# Patient Record
Sex: Male | Born: 1995 | Race: White | Hispanic: No | Marital: Single | Smoking: Never smoker
Health system: Southern US, Community
[De-identification: ages and names within clinical notes are randomized; demographics above are authoritative.]

## PROBLEM LIST (undated history)

## (undated) DIAGNOSIS — F32A Depression, unspecified: Secondary | ICD-10-CM

## (undated) DIAGNOSIS — F419 Anxiety disorder, unspecified: Secondary | ICD-10-CM

## (undated) DIAGNOSIS — F329 Major depressive disorder, single episode, unspecified: Secondary | ICD-10-CM

## (undated) HISTORY — DX: Anxiety disorder, unspecified: F41.9

## (undated) HISTORY — DX: Major depressive disorder, single episode, unspecified: F32.9

## (undated) HISTORY — DX: Depression, unspecified: F32.A

---
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
## 2014-03-11 ENCOUNTER — Ambulatory Visit (INDEPENDENT_AMBULATORY_CARE_PROVIDER_SITE_OTHER): Payer: BLUE CROSS/BLUE SHIELD | Admitting: Emergency Medicine

## 2014-03-11 VITALS — BP 140/90 | HR 64 | Temp 98.4°F | Resp 16 | Ht 66.0 in | Wt 173.0 lb

## 2014-03-11 DIAGNOSIS — F4323 Adjustment disorder with mixed anxiety and depressed mood: Secondary | ICD-10-CM

## 2014-03-11 DIAGNOSIS — E291 Testicular hypofunction: Secondary | ICD-10-CM

## 2014-03-11 LAB — FSH/LH
FSH: 2.7 m[IU]/mL (ref 1.4–18.1)
LH: 1.7 m[IU]/mL (ref 1.5–9.3)

## 2014-03-11 LAB — THYROID PANEL WITH TSH
Free Thyroxine Index: 2.3 (ref 1.4–3.8)
T3 UPTAKE: 30 % (ref 22–35)
T4 TOTAL: 7.5 ug/dL (ref 4.5–12.0)
TSH: 1.325 u[IU]/mL (ref 0.350–4.500)

## 2014-03-11 LAB — POCT CBC
GRANULOCYTE PERCENT: 55.6 % (ref 37–80)
HCT, POC: 48.2 % (ref 43.5–53.7)
HEMOGLOBIN: 16.3 g/dL (ref 14.1–18.1)
Lymph, poc: 2 (ref 0.6–3.4)
MCH: 33 pg — AB (ref 27–31.2)
MCHC: 33.9 g/dL (ref 31.8–35.4)
MCV: 97.4 fL — AB (ref 80–97)
MID (CBC): 0.5 (ref 0–0.9)
MPV: 6.1 fL (ref 0–99.8)
POC GRANULOCYTE: 3.2 (ref 2–6.9)
POC LYMPH PERCENT: 35.3 %L (ref 10–50)
POC MID %: 9.1 % (ref 0–12)
Platelet Count, POC: 259 10*3/uL (ref 142–424)
RBC: 4.95 M/uL (ref 4.69–6.13)
RDW, POC: 12 %
WBC: 5.7 10*3/uL (ref 4.6–10.2)

## 2014-03-11 LAB — CORTISOL: Cortisol, Plasma: 10.9 ug/dL

## 2014-03-11 NOTE — Progress Notes (Deleted)
   Subjective:    Patient ID: Damon Brown, male    DOB: 04/24/1995, 18 y.o.   MRN: [id number]  HPI    Review of Systems     Objective:   Physical Exam        Assessment & Plan:   

## 2014-03-11 NOTE — Progress Notes (Signed)
   Subjective:    Patient ID: Damon Brown, male    DOB: 16-Dec-1995, 19 y.o.   MRN: [id number] This chart was scribed for Damon ChrisSteven Dore Oquin, MD by Jolene Provostobert Halas, Medical Scribe. This patient was seen in Room 11 and the patient's care was started at 8:30 AM.  Chief Complaint  Patient presents with  . Establish Care  . Labs Only    Testosterone level low    HPI HPI Comments: Damon MaudlinWilliam C Jeffords is a 19 y.o. male with a hx of substance abuse, anxiety, depression, and SI who [facility] for a regular check up. Pt states he had a regular checkup recently with another physician who told him he has low Testerone. Pt states he has a hx of abusing benzos and opiates. Pt states he has lived in [place] and [place] for the last three years, and during that time he was in a residential substance abuse program, followed by an out patient drug treatment program. Pt states he is not sober right now, and has been smoking weed. Pt states he is not going to meetings right now. Pt denies using IV drugs.  Pt states he is working for an [employer] company, and is planning on attending college in the fall.     Review of Systems  Constitutional: Positive for fatigue. Negative for fever and chills.  Cardiovascular: Negative for chest pain.  Gastrointestinal: Negative for abdominal pain.  Musculoskeletal: Negative for back pain.  Skin: Negative for wound.       Objective:   Physical Exam  Constitutional: He is oriented to person, place, and time. He appears well-developed and well-nourished.  Very muscular male.  HENT:  Head: Normocephalic and atraumatic.  Eyes: Pupils are equal, round, and reactive to light.  Neck: Neck supple.  Cardiovascular: Normal rate and regular rhythm.   Pulmonary/Chest: Effort normal and breath sounds normal. No respiratory distress.  Genitourinary:  Testicles are a normal size, no masses.  Neurological: He is alert and oriented to person, place, and time.  Skin: Skin  is warm and dry.  Psychiatric: He has a normal mood and affect. His behavior is normal.  Nursing note and vitals reviewed.      Assessment & Plan:  Patient currently on the mechanical for mood disorder. Referral made to psychiatry for management of suspected mood disorder. He has a history of very low testosterone. 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 He is very muscular on exam. His testicle exam is normal. Routine labs were done today. I encouraged him to receive counseling through substance abuse counselor. He is resistant to this at present but will think about it. He continues to use marijuana.I personally performed the services described in this documentation, which was scribed in my presence. The recorded information has been reviewed and is accurate.

## 2014-03-12 LAB — TESTOSTERONE, FREE, TOTAL, SHBG
SEX HORMONE BINDING: 30 nmol/L (ref 10–50)
TESTOSTERONE FREE: 93.4 pg/mL (ref 47.0–244.0)
Testosterone-% Free: 2.2 % (ref 1.6–2.9)
Testosterone: 431 ng/dL (ref 300–890)

## 2014-03-18 LAB — PROLACTIN, TOTAL AND MONOMERIC
PROLACTIN, MONOMERIC: 3.8 ng/mL (ref 3.4–14.8)
PROLACTIN, TOTAL: 4.2 ng/mL (ref 2.0–18.0)

## 2014-04-07 ENCOUNTER — Encounter: Payer: Self-pay | Admitting: Emergency Medicine

## 2014-04-07 ENCOUNTER — Ambulatory Visit (INDEPENDENT_AMBULATORY_CARE_PROVIDER_SITE_OTHER): Payer: BLUE CROSS/BLUE SHIELD | Admitting: Emergency Medicine

## 2014-04-07 VITALS — BP 107/62 | HR 61 | Temp 97.9°F | Resp 16 | Ht 66.5 in | Wt 164.0 lb

## 2014-04-07 DIAGNOSIS — J029 Acute pharyngitis, unspecified: Secondary | ICD-10-CM

## 2014-04-07 DIAGNOSIS — F1910 Other psychoactive substance abuse, uncomplicated: Secondary | ICD-10-CM | POA: Diagnosis not present

## 2014-04-07 DIAGNOSIS — F39 Unspecified mood [affective] disorder: Secondary | ICD-10-CM

## 2014-04-07 LAB — POCT RAPID STREP A (OFFICE): Rapid Strep A Screen: NEGATIVE

## 2014-04-07 MED ORDER — LAMOTRIGINE 25 MG PO TABS: 25.0000 mg | ORAL_TABLET | Freq: Two times a day (BID) | ORAL | Status: DC

## 2014-04-07 NOTE — Progress Notes (Deleted)
   Subjective:    Patient ID: Damon Brown, male    DOB: 02/13/1995, 18 y.o.   MRN: [id number]  HPI    Review of Systems     Objective:   Physical Exam        Assessment & Plan:   

## 2014-04-07 NOTE — Progress Notes (Signed)
Subjective:  This chart was scribed for Damon LitesSteve Kiefer Opheim, MD by Damon PaoNadim Abu Brown, ED Scribe at [facility].The patient was seen in exam room 21 and the patient's care was started at 10:20 AM.   Patient ID: Damon Brown, male    DOB: 13-Aug-1995, 19 y.o.   MRN: [id number] Chief Complaint  Patient presents with  . Medication Management  . Cough  . Nasal Congestion   Cough Associated symptoms include a sore throat. Pertinent negatives include no fever.   HPI Comments: Damon Brown is a 19 y.o. male with a history of benzo and opiate abuse,  depression, anxiety and suicidal ideation who presents to [facility]. Pt is currently taking Lamictal 25 mg twice daily. His depression, anxiety and manic episodes have improved. He has been attending [facility] with his cousin. He does not have the funds to attend formally but the program is allowing him to come to meetings. Pt states the [facility] program has been very helpful. He has topped smoking marijuana 2.5 weeks ago. He starts working next week, at [employer] until next fall where he plans to go to school. He is still living with his father, who is currently sober. Pt is aware this is a bad environment and  he wants to move out but does not have the money to do so. He may live with grandmother but he must first prove himself. 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 He developed a sore throat onset 3 days ago. This gradually worsened on Sunday and Monday with associated productive cough, and congestion. He has been taking OTC medications for relief. He denies fever.  Patient Active Problem List   Diagnosis Date Noted  . Abnormal LFTs 11/24/2011  . Depression 03/30/2011  . Anxiety 03/30/2011  . Bulimia 03/30/2011  . Suicide ideation 03/30/2011  . School failure 03/30/2011   Past Medical History  Diagnosis Date  . Anxiety   . Depression    No past surgical history on  file. No Known Allergies Prior to Admission medications   Medication Sig Start Date End Date Taking? Authorizing Provider  amphetamine-dextroamphetamine (ADDERALL) 20 MG tablet  10/07/11   Historical Provider, MD  FLUoxetine (PROZAC) 40 MG capsule Take 60 mg by mouth daily.     Historical Provider, MD  ipratropium (ATROVENT) 0.02 % nebulizer solution  01/10/11   Historical Provider, MD  lamoTRIgine (LAMICTAL) 100 MG tablet Take 100 mg by mouth daily.    Historical Provider, MD  lansoprazole (PREVACID) 30 MG capsule  12/26/10   Historical Provider, MD  PROAIR HFA 108 (90 BASE) MCG/ACT inhaler  02/02/11   Historical Provider, MD   History   Social History  . Marital Status: Single    Spouse Name: N/A  . Number of Children: N/A  . Years of Education: N/A   Occupational History  . Not on file.   Social History Main Topics  . Smoking status: Never Smoker   . Smokeless tobacco: Not on file  . Alcohol Use: Not on file  . Drug Use: Not on file  . Sexual Activity: Not on file   Other Topics Concern  . Not on file   Social History Narrative   Review of Systems  Constitutional: Negative for fever.  HENT: Positive for congestion and sore throat.   Respiratory: Positive for cough.   Psychiatric/Behavioral: The patient is nervous/anxious.       Objective:  BP 107/62 mmHg  Pulse 61  Temp(Src) 97.9 F (36.6 C)  Resp 16  Ht 5' 6.5" (1.689 m)  Wt 164 lb (74.39 kg)  BMI 26.08 kg/m2  SpO2 97% Physical Exam  Nursing note and vitals reviewed. CONSTITUTIONAL: Well developed/well nourished HEAD: Normocephalic/atraumatic EYES: EOMI/PERRL ENMT: Mucous membranes moist, nasal congestion and mild redness to he back of this throat. NECK: supple no meningeal signs SPINE/BACK:entire spine nontender CV: S1/S2 noted, no murmurs/rubs/gallops noted LUNGS: Lungs are clear to auscultation bilaterally, no apparent distress ABDOMEN: soft, nontender, no rebound or guarding, bowel sounds noted  throughout abdomen GU:no cva tenderness NEURO: Pt is awake/alert/appropriate, moves all extremitiesx4.  No facial droop.   EXTREMITIES: pulses normal/equal, full ROM SKIN: warm, color normal PSYCH: no abnormalities of mood noted, alert and oriented to situation Results for orders placed or performed in visit on 04/07/14  POCT rapid strep A  Result Value Ref Range   Rapid Strep A Screen Negative Negative      Assessment & Plan:   Patient doing well. He is to call if he has worsening of his sinusitis. Otherwise we'll continue to treat symptomatically. Recheck in 6 weeks. He will continue with the current program he is working with. He is due to start work next week. He is looking for different place to stay at the present time. I did refill his Lamictal he takes twice a day.I personally performed the services described in this documentation, which was scribed in my presence. The recorded information has been reviewed and is accurate.

## 2014-04-07 NOTE — Progress Notes (Deleted)
   Subjective:    Patient ID: Damon Brown, male    DOB: 1995-11-25, 19 y.o.   MRN: [id number]  HPI    Review of Systems     Objective:   Physical Exam        Assessment & Plan:

## 2014-04-07 NOTE — Progress Notes (Signed)
Subjective:  This chart was scribed for [name], MD by Haywood Pao, ED Scribe at [facility].The patient was seen in exam room 21 and the patient's care was started at 10:20 AM.   Patient ID: Damon Brown, male    DOB: April 06, 1995, 19 y.o.   MRN: [id number] Chief Complaint  Patient presents with   Medication Management   Cough   Nasal Congestion   HPI HPI Comments: Damon Brown is a 19 y.o. male with a history of benzo and opiate abuse,  depression, anxiety and suicidal ideation who presents to [facility]. Pt is currently taking Lamictal 25 mg twice daily. His depression, anxiety and manic episodes have improved. He has been attending [employer] program with his cousin. He does not have the funds to attend formally but the program is allowing him to come to meetings. Pt states the [employer] program has been very helpful. He has topped smoking marijuana 2.5 weeks ago. He starts working next week, at [employer] until next fall where he plans to go to school. He is still living with his father, who is currently sober. Pt is aware this is a bad environment and  he wants to move out but does not have the money to do so. He may live with grandmother but he must first prove himself. 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 He developed a sore throat onset 3 days ago. This gradually worsened on Sunday and Monday with associated productive cough, and congestion. He has been taking OTC medications for relief. He denies fever.  Patient Active Problem List   Diagnosis Date Noted   Abnormal LFTs 11/24/2011   Depression 03/30/2011   Anxiety 03/30/2011   Bulimia 03/30/2011   Suicide ideation 03/30/2011   School failure 03/30/2011   Past Medical History  Diagnosis Date   Anxiety    Depression    No past surgical history on file. No Known Allergies Prior to Admission medications   Medication Sig Start Date End  Date Taking? Authorizing Provider  amphetamine-dextroamphetamine (ADDERALL) 20 MG tablet  10/07/11   Historical Provider, MD  FLUoxetine (PROZAC) 40 MG capsule Take 60 mg by mouth daily.     Historical Provider, MD  ipratropium (ATROVENT) 0.02 % nebulizer solution  01/10/11   Historical Provider, MD  lamoTRIgine (LAMICTAL) 100 MG tablet Take 100 mg by mouth daily.    Historical Provider, MD  lansoprazole (PREVACID) 30 MG capsule  12/26/10   Historical Provider, MD  PROAIR HFA 108 (90 BASE) MCG/ACT inhaler  02/02/11   Historical Provider, MD   History   Social History   Marital Status: Single    Spouse Name: N/A   Number of Children: N/A   Years of Education: N/A   Occupational History   Not on file.   Social History Main Topics   Smoking status: Never Smoker    Smokeless tobacco: Not on file   Alcohol Use: Not on file   Drug Use: Not on file   Sexual Activity: Not on file   Other Topics Concern   Not on file   Social History Narrative   Review of Systems  Constitutional: Negative for fever.  HENT: Positive for congestion and sore throat.   Respiratory: Positive for cough.   Psychiatric/Behavioral: The patient is nervous/anxious.       Objective:  BP 107/62 mmHg   Pulse 61   Temp(Src) 97.9 F (  36.6 C)   Resp 16   Ht 5' 6.5" (1.689 m)   Wt 164 lb (74.39 kg)   BMI 26.08 kg/m2   SpO2 97% Physical Exam  Nursing note and vitals reviewed. CONSTITUTIONAL: Well developed/well nourished HEAD: Normocephalic/atraumatic EYES: EOMI/PERRL ENMT: Mucous membranes moist, nasal congestion and mild redness to he back of this throat. NECK: supple no meningeal signs SPINE/BACK:entire spine nontender CV: S1/S2 noted, no murmurs/rubs/gallops noted LUNGS: Lungs are clear to auscultation bilaterally, no apparent distress ABDOMEN: soft, nontender, no rebound or guarding, bowel sounds noted throughout abdomen GU:no cva tenderness NEURO: Pt is awake/alert/appropriate, moves all  extremitiesx4.  No facial droop.   EXTREMITIES: pulses normal/equal, full ROM SKIN: warm, color normal PSYCH: no abnormalities of mood noted, alert and oriented to situation Results for orders placed or performed in visit on 04/07/14  POCT rapid strep A  Result Value Ref Range   Rapid Strep A Screen Negative Negative      Assessment & Plan:

## 2014-04-07 NOTE — Progress Notes (Deleted)
   Subjective:    Patient ID: Damon Brown, male    DOB: [birth year], 19 y.o.   MRN: [id number]  Cough      Review of Systems  Respiratory: Positive for cough.        Objective:   Physical Exam        Assessment & Plan:

## 2014-04-08 LAB — CULTURE, GROUP A STREP: ORGANISM ID, BACTERIA: NORMAL

## 2014-05-19 ENCOUNTER — Ambulatory Visit (INDEPENDENT_AMBULATORY_CARE_PROVIDER_SITE_OTHER): Payer: BLUE CROSS/BLUE SHIELD | Admitting: Emergency Medicine

## 2014-05-19 ENCOUNTER — Encounter: Payer: Self-pay | Admitting: Emergency Medicine

## 2014-05-19 VITALS — BP 116/65 | HR 58 | Temp 98.8°F | Resp 16 | Ht 66.0 in | Wt 166.0 lb

## 2014-05-19 DIAGNOSIS — F39 Unspecified mood [affective] disorder: Secondary | ICD-10-CM | POA: Diagnosis not present

## 2014-05-19 NOTE — Patient Instructions (Signed)
Please follow up with Karmen BongoAaron Stewart [phone].

## 2014-05-19 NOTE — Progress Notes (Addendum)
Subjective:  This chart was scribed for Damon Gobble, MD by Damon Brown, at [facility].  This patient was seen in room 22 and the patient's care was started at 1:31 PM.    Patient ID: Damon Brown, male    DOB: 12-14-1995, 19 y.o.   MRN: [id number]  HPI  HPI Comments: Damon Brown is a 19 y.o. male who presents to [facility] for a follow up regarding his substance abuse. He states that his medication is helping him a lot with his moods.  He states he still has some spurts of mood swings but they do not last long at all. He is currently on Lamictal 25 mg's 2X a day.   He denies any suicidal ideations and has not seen Dr. Remy Brown. He is requesting the name to therapist so that he has something to talk to.  He currently lives with his grandmother and feels safe there.He has a floor to himself at her home and states that he is content.  Patient is now hanging out with friends who were in his program and states his friendships are going well. He feels like he can call them for support when he really needs someone to talk to.  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  He has a competition coming up soon at [employer].  He does not have a girlfriend and says he does not have time for one. He is planing on taking a trip to [place] this summer to see old friends.  He has no other complaints today.     Patient Active Problem List   Diagnosis Date Noted  . Abnormal LFTs 11/24/2011  . Depression 03/30/2011  . Anxiety 03/30/2011  . Bulimia 03/30/2011  . Suicide ideation 03/30/2011  . School failure 03/30/2011   Past Medical History  Diagnosis Date  . Anxiety   . Depression    No past surgical history on file. No Known Allergies Prior to Admission medications   Medication Sig Start Date End Date Taking? Authorizing Provider  ipratropium (ATROVENT) 0.02 % nebulizer solution  01/10/11    Historical Provider, MD  lamoTRIgine (LAMICTAL) 25 MG tablet Take 1 tablet (25 mg total) by mouth 2 (two) times daily. 04/07/14   Damon Gobble, MD  PROAIR HFA 108 [phone] BASE) MCG/ACT inhaler  02/02/11   Historical Provider, MD   History   Social History  . Marital Status: Single    Spouse Name: N/A  . Number of Children: N/A  . Years of Education: N/A   Occupational History  . Not on file.   Social History Main Topics  . Smoking status: Never Smoker   . Smokeless tobacco: Not on file  . Alcohol Use: Not on file  . Drug Use: Not on file  . Sexual Activity: Not on file   Other Topics Concern  . Not on file   Social History Narrative         Review of Systems  Constitutional: Negative for fever, chills, diaphoresis and fatigue.  HENT: Negative for congestion, nosebleeds, postnasal drip, rhinorrhea and sinus pressure.   Respiratory: Negative for cough, shortness of breath, wheezing and stridor.   Psychiatric/Behavioral: Negative for suicidal ideas.       Objective:   Physical Exam PSYCH: Patient in a good mood, happy and cooperative.     Filed Vitals:   05/19/14 1319  BP: 116/65  Pulse: 58  Temp: 98.8 F (37.1 C)  TempSrc: Oral  Resp: 16  Height: 5\' 6"  (1.676 m)  Weight: 166 lb (75.297 kg)  SpO2: 96%          Assessment & Plan:  Patient stable at present on Lamictal. Referral made to Damon Brown for counseling. He is looking forward to his upcoming competition. Work is been going well. He plans a trip to [place] the summer.I personally performed the services described in this documentation, which was scribed in my presence. The recorded information has been reviewed and is accurate. I did speak personally to Damon Brown and then spoke with Damon Brown to let her know I had spoken with Damon Brown. I tried to call Damon Brown but his voice mailbox was full.

## 2014-06-30 ENCOUNTER — Ambulatory Visit: Payer: BLUE CROSS/BLUE SHIELD | Admitting: Emergency Medicine

## 2014-07-21 ENCOUNTER — Encounter: Payer: Self-pay | Admitting: Emergency Medicine

## 2014-07-21 ENCOUNTER — Ambulatory Visit (INDEPENDENT_AMBULATORY_CARE_PROVIDER_SITE_OTHER): Payer: BLUE CROSS/BLUE SHIELD | Admitting: Emergency Medicine

## 2014-07-21 VITALS — BP 131/72 | HR 78 | Temp 98.4°F | Resp 16 | Ht 66.0 in | Wt 173.0 lb

## 2014-07-21 DIAGNOSIS — F39 Unspecified mood [affective] disorder: Secondary | ICD-10-CM | POA: Diagnosis not present

## 2014-07-21 NOTE — Progress Notes (Signed)
   Subjective:    Patient ID: Damon Brown, male    DOB: [birth year], 19 y.o.   MRN: [id number] This chart was scribed for Lesle Chris, MD by Littie Deeds, Medical Scribe. This patient was seen in room 21 and the patient's care was started at 4:16 PM.   HPI HPI Comments: Damon Brown is a 19 y.o. male with a hx of depression and anxiety who presents to the [facility] for a follow-up. He has been doing well overall; his mood has been relatively stable. Patient works with some friends doing Lobbyist work. He will continue school again in the fall at [employer]. This has relieved some stress, knowing he will continue his education. He plans to continue working part-time once school starts. 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 He is currently living with his father, who is currently sober. His father previously had problems with alcohol and drugs, but his father has been doing much better with this. Things are fine living with his father. His cousin is currently in a program for a recent DUI. He also has a good group of friends currently who have goals. Patient states his eating habits have been good. He decided to cancel the competition because there was too much pressure on his eating habits; he may still end up doing a competition in the future. He has continued to exercise every day.   Patient did see Karmen Bongo and Dr. [place] Sink recently. He had good visits with them and gets along with them very well. The Lamictal has been helping with his anxiety. He had forgotten to take the Lamictal once and was able to see a noticeable difference when not taking the medication; without it, his mind moves very quickly. Patient denies SI. He does use marijuana occasionally, but does not drink alcohol.  Review of Systems  Psychiatric/Behavioral: Negative for suicidal ideas.       Objective:   Physical Exam CONSTITUTIONAL: Well developed/well  nourished HEAD: Normocephalic/atraumatic EYES: EOM/PERRL ENMT: Mucous membranes moist NECK: supple no meningeal signs SPINE: entire spine nontender CV: S1/S2 noted, no murmurs/rubs/gallops noted LUNGS: Lungs are clear to auscultation bilaterally, no apparent distress ABDOMEN: soft, nontender, no rebound or guarding GU: no cva tenderness NEURO: Pt is awake/alert, moves all extremitiesx4 EXTREMITIES: pulses normal, full ROM SKIN: warm, color normal PSYCH: no abnormalities of mood noted      Assessment & Plan:  Patient is doing well. He will continue Lamictal 50 mg a day. He will continue follow-up with Dr.  Sink and also with Karmen Bongo.I personally performed the services described in this documentation, which was scribed in my presence. The recorded information has been reviewed and is accurate.  [name], MD

## 2014-10-27 ENCOUNTER — Ambulatory Visit: Payer: BLUE CROSS/BLUE SHIELD | Admitting: Emergency Medicine

## 2014-12-28 ENCOUNTER — Encounter: Payer: Self-pay | Admitting: Internal Medicine

## 2015-08-23 NOTE — Progress Notes (Signed)
This encounter was created in error - please disregard.

## 2019-05-02 ENCOUNTER — Ambulatory Visit: Payer: Self-pay | Attending: Internal Medicine

## 2019-05-02 DIAGNOSIS — Z23 Encounter for immunization: Secondary | ICD-10-CM

## 2019-05-02 NOTE — Progress Notes (Signed)
   Covid-19 Vaccination Clinic  Name:  Danilo Cappiello    MRN: [id number] DOB: 12-28-1995  05/02/2019  Mr. Kosar was observed post Covid-19 immunization for 15 minutes without incident. He was provided with Vaccine Information Sheet and instruction to access the V-Safe system.   Mr. Grafton was instructed to call 911 with any severe reactions post vaccine: Marland Kitchen Difficulty breathing  . Swelling of face and throat  . A fast heartbeat  . A bad rash all over body  . Dizziness and weakness   Immunizations Administered    Name Date Dose VIS Date Route   Pfizer COVID-19 Vaccine 05/02/2019 11:02 AM 0.3 mL 01/10/2019 Intramuscular   Manufacturer: [employer], [employer]   Lot: [id number]   NDC: [address]-9

## 2019-05-26 ENCOUNTER — Ambulatory Visit: Payer: Self-pay | Attending: Internal Medicine

## 2019-05-26 DIAGNOSIS — Z23 Encounter for immunization: Secondary | ICD-10-CM

## 2019-05-26 NOTE — Progress Notes (Signed)
   Covid-19 Vaccination Clinic  Name:  Damon Brown    MRN: [id number] DOB: 1995-02-21  05/26/2019  Mr. [name] was observed post Covid-19 immunization for 15 minutes without incident. He was provided with Vaccine Information Sheet and instruction to access the V-Safe system.   Mr. [name] was instructed to call 911 with any severe reactions post vaccine: Marland Kitchen Difficulty breathing  . Swelling of face and throat  . A fast heartbeat  . A bad rash all over body  . Dizziness and weakness   Immunizations Administered    Name Date Dose VIS Date Route   Pfizer COVID-19 Vaccine 05/26/2019  3:57 PM 0.3 mL 03/26/2018 Intramuscular   Manufacturer: [employer], [employer]   Lot: [id number]   NDC: [address]-6

## 2019-09-25 ENCOUNTER — Other Ambulatory Visit: Payer: Self-pay

## 2019-09-25 ENCOUNTER — Encounter: Payer: Self-pay | Admitting: Adult Health

## 2019-09-25 ENCOUNTER — Telehealth: Payer: Self-pay | Admitting: Adult Health

## 2019-09-25 ENCOUNTER — Ambulatory Visit (INDEPENDENT_AMBULATORY_CARE_PROVIDER_SITE_OTHER): Payer: 59 | Admitting: Adult Health

## 2019-09-25 VITALS — BP 115/75 | HR 59 | Ht 67.0 in | Wt 187.0 lb

## 2019-09-25 DIAGNOSIS — F909 Attention-deficit hyperactivity disorder, unspecified type: Secondary | ICD-10-CM

## 2019-09-25 MED ORDER — LISDEXAMFETAMINE DIMESYLATE 30 MG PO CAPS: 30.0000 mg | ORAL_CAPSULE | Freq: Every day | ORAL | 0 refills | Status: DC

## 2019-09-25 NOTE — Progress Notes (Signed)
[facility] MD/PA/NP Initial Note  09/25/2019 1:34 PM Damon Brown  MRN:  [id number]  Chief Complaint:   HPI:   Describes mood today as "ok". Pleasant. Mood symptoms - denies depression, anxiety, and irritability. Stating "my mood is good". Is wanting to restart ADHD medications. Took medications in elementary and high school. Has not been on ADHD medications since age 24. Struggles in the work and at home setting. Gets "side tracked" a lot. Starting tasks and not finishing them". Stating "ADHD effects every area of my life". Stable interest and motivation. Taking medications as prescribed.  Energy levels stable. Active, has a regular exercise routine. Works out 5 days a week. Enjoys some usual interests and activities. Single. Lives with a girlfriend of 2 years. Parents local and supportive. Spending time with family. Appetite adequate. Weight stable - 187 pounds.. Sleeps well most nights. Averages 8 hours. Focus and concentration difficulties. Diagnosed with ADHD in childhood. Stating "I have the attention span of a squirrel sometimes". Completing tasks. Managing aspects of household. Works at [employer] and is pursuing a Chiropodist.  Denies SI or HI.  Denies AH or VH.  Attended a wilderness program at 16 for 3 months -  Tried medication - can't remember. Graduated and went to an inpatient program in [place] for a year and a half. Had a history of eating disorder and bulemic. Used drugs - "not an attack". Stating "I was young when all that happened and I am much better".    Previous medication trials: Vyvanse, Lamictal, Prozac  Visit Diagnosis:    ICD-10-CM   1. Attention deficit hyperactivity disorder (ADHD), unspecified ADHD type  F90.9 lisdexamfetamine (VYVANSE) 30 MG capsule    Past Psychiatric History: Admitted at age 55 to 78 for depression for 2 days. Denies self harm.   Past Medical History:  Past Medical History:  Diagnosis Date  . Anxiety   .  Depression    No past surgical history on file.  Family Psychiatric History: Father has ADHD - depression.   Family History:  Family History  Problem Relation Age of Onset  . Drug abuse Father   . Cancer Maternal Grandmother        breast  . Cancer Maternal Grandfather        colon    Social History:  Social History   Socioeconomic History  . Marital status: Single    Spouse name: Not on file  . Number of children: Not on file  . Years of education: Not on file  . Highest education level: Not on file  Occupational History  . Not on file  Tobacco Use  . Smoking status: Never Smoker  . Smokeless tobacco: Never Used  Substance and Sexual Activity  . Alcohol use: Not on file  . Drug use: Not on file  . Sexual activity: Not on file  Other Topics Concern  . Not on file  Social History Narrative  . Not on file   Social Determinants of Health   Financial Resource Strain:   . Difficulty of Paying Living Expenses: Not on file  Food Insecurity:   . Worried About Programme researcher, broadcasting/film/video in the Last Year: Not on file  . Ran Out of Food in the Last Year: Not on file  Transportation Needs:   . Lack of Transportation (Medical): Not on file  . Lack of Transportation (Non-Medical): Not on file  Physical Activity:   . Days of Exercise per Week: Not on file  .  Minutes of Exercise per Session: Not on file  Stress:   . Feeling of Stress : Not on file  Social Connections:   . Frequency of Communication with Friends and Family: Not on file  . Frequency of Social Gatherings with Friends and Family: Not on file  . Attends Religious Services: Not on file  . Active Member of Clubs or Organizations: Not on file  . Attends Banker Meetings: Not on file  . Marital Status: Not on file    Allergies: No Known Allergies  Metabolic Disorder Labs: No results found for: HGBA1C, MPG No results found for: PROLACTIN No results found for: CHOL, TRIG, HDL, CHOLHDL, VLDL, LDLCALC Lab  Results  Component Value Date   TSH 1.325 03/11/2014   TSH 0.495 11/22/2011    Therapeutic Level Labs: No results found for: LITHIUM No results found for: VALPROATE No components found for:  CBMZ  Current Medications: Current Outpatient Medications  Medication Sig Dispense Refill  . lisdexamfetamine (VYVANSE) 30 MG capsule Take 1 capsule (30 mg total) by mouth daily. 30 capsule 0   No current facility-administered medications for this visit.    Medication Side Effects: none  Orders placed this visit:  No orders of the defined types were placed in this encounter.   Psychiatric Specialty Exam:  Review of Systems  Musculoskeletal: Negative for gait problem.  Neurological: Negative for tremors.  Psychiatric/Behavioral:       Please refer to HPI    Blood pressure 115/75, pulse (!) 59, height 5\' 7"  (1.702 m), weight 187 lb (84.8 kg).Body mass index is 29.29 kg/m.  General Appearance: Casual, Neat and Well Groomed  Eye Contact:  Good  Speech:  Clear and Coherent and Normal Rate  Volume:  Normal  Mood:  Euthymic  Affect:  Appropriate and Congruent  Thought Process:  Coherent and Descriptions of Associations: Intact  Orientation:  Full (Time, Place, and Person)  Thought Content: Logical   Suicidal Thoughts:  No  Homicidal Thoughts:  No  Memory:  WNL  Judgement:  Good  Insight:  Good  Psychomotor Activity:  Normal  Concentration:  Concentration: Good  Recall:  Good  Fund of Knowledge: Good  Language: Good  Assets:  Communication Skills Desire for Improvement Financial Resources/Insurance Housing Intimacy Leisure Time Physical Health Resilience Social Support Talents/Skills Transportation Vocational/Educational  ADL's:  Intact  Cognition: WNL  Prognosis:  Good   Screenings:  PHQ2-9     Office Visit from 07/21/2014 in Primary Care at [place] Office Visit from 05/19/2014 in Primary Care at [facility] Total Score 0 0      Receiving Psychotherapy: No    Treatment Plan/Recommendations:   Plan:  PDMP reviewed  1. Add Vyvanse 30mg  capsule  Read and reviewed note with patient for accuracy.   RTC 4 weeks  Patient advised to contact office with any questions, adverse effects, or acute worsening in signs and symptoms.   [facility], NP

## 2019-09-25 NOTE — Telephone Encounter (Signed)
Noted thank you. I will check into this.

## 2019-09-25 NOTE — Telephone Encounter (Signed)
Damon Brown called to report that he went to the Pharmacy to get his Vyvanse but was told it will need a PA.  I told Damon Brown the pharmacy should send [place] info about the PA and we would initiate the PA.  For history he has not ever tried any other like meds.  He was prescribed Vyvanse 10 yrs ago and it worked so he never had to try anything else.  Then he stopped medications and asked for Vyvanse because that is what he took before.

## 2019-09-26 NOTE — Telephone Encounter (Signed)
Prior authorization had not been initiated by his pharmacy. PA initiated by nurse with covermymeds for VYVYANSE 30 mg. Information submitted, pending response at this time. There are 3 formularies that are preferred so patient may need to try and fail all 3 before being able to get Vyvanse. Will wait for determination before proceeding.

## 2019-09-29 ENCOUNTER — Other Ambulatory Visit: Payer: Self-pay

## 2019-09-29 MED ORDER — ADDERALL XR 20 MG PO CP24: 20.0000 mg | ORAL_CAPSULE | Freq: Every day | ORAL | 0 refills | Status: DC

## 2019-09-29 NOTE — Telephone Encounter (Signed)
Prior authorization determination was DENIED for Vyvanse due to [employer] requires patient try and fail ALL formularies listed on his plan: Brand Adderall XR, Brand Concerta, Methylphenidate XR capsule (generic Metadate CD or Ritalin [place])   Patient notified of above information and would like to try Adderall XR first. Please send to his pharmacy.

## 2019-09-29 NOTE — Telephone Encounter (Signed)
We can send in the Adderall XR 20mg .

## 2019-09-29 NOTE — Telephone Encounter (Signed)
Pended Rx for Brand Adderall XR 20 mg, insurance prefers Brand

## 2019-10-23 ENCOUNTER — Encounter: Payer: Self-pay | Admitting: Adult Health

## 2019-10-23 ENCOUNTER — Other Ambulatory Visit: Payer: Self-pay

## 2019-10-23 ENCOUNTER — Ambulatory Visit (INDEPENDENT_AMBULATORY_CARE_PROVIDER_SITE_OTHER): Payer: 59 | Admitting: Adult Health

## 2019-10-23 VITALS — BP 116/71 | HR 89

## 2019-10-23 DIAGNOSIS — F909 Attention-deficit hyperactivity disorder, unspecified type: Secondary | ICD-10-CM | POA: Diagnosis not present

## 2019-10-23 MED ORDER — ADDERALL XR 20 MG PO CP24: 20.0000 mg | ORAL_CAPSULE | Freq: Every day | ORAL | 0 refills | Status: DC

## 2019-10-23 NOTE — Progress Notes (Signed)
Damon Brown [id number] 05-08-1995 24 y.o.  Subjective:   Patient ID:  Damon Brown is a 24 y.o. (DOB Apr 30, 1995) male.  Chief Complaint: No chief complaint on file.   HPI Damon Brown presents to the office today for follow-up of ADHD.   Describes mood today as "ok". Pleasant. Mood symptoms - denies depression, anxiety, and irritability. Stating "I'm doing good". Feels like addition of Adderall XR 20 has been helpful to manage symptoms. Would like to continue at current dose - stating "it's a good fit for me". Stable interest and motivation. Taking medications as prescribed.  Energy levels stable. Active, has a regular exercise routine - 5 days a week. Enjoys some usual interests and activities. Single. Lives with a girlfriend of 2 years. Parents local and supportive. Spending time with family. Appetite adequate. Weight loss - 178 pounds.. Sleeps well most nights. Averages 7.5 to 8 hours. Focus and concentration improved. Completing tasks. Managing aspects of household. Work going well. Works at [employer] and is pursuing a Restaurant manager, fast food.  Denies SI or HI.  Denies AH or VH.   PHQ2-9     Office Visit from 07/21/2014 in Primary Care at [place] from 05/19/2014 in Primary Care at [facility] Total Score 0 0       Review of Systems:  Review of Systems  Musculoskeletal: Negative for gait problem.  Neurological: Negative for tremors.  Psychiatric/Behavioral:       Please refer to HPI    Medications: I have reviewed the patient's current medications.  Current Outpatient Medications  Medication Sig Dispense Refill  . ADDERALL XR 20 MG 24 hr capsule Take 1 capsule (20 mg total) by mouth daily. 30 capsule 0  . [START ON 11/20/2019] ADDERALL XR 20 MG 24 hr capsule Take 1 capsule (20 mg total) by mouth daily. 30 capsule 0  . [START ON 12/18/2019] ADDERALL XR 20 MG 24 hr capsule Take 1 capsule (20 mg total) by mouth daily. 30 capsule 0   No  current facility-administered medications for this visit.    Medication Side Effects: None  Allergies: No Known Allergies  Past Medical History:  Diagnosis Date  . Anxiety   . Depression     Family History  Problem Relation Age of Onset  . Drug abuse Father   . Cancer Maternal Grandmother        breast  . Cancer Maternal Grandfather        colon    Social History   Socioeconomic History  . Marital status: Single    Spouse name: Not on file  . Number of children: Not on file  . Years of education: Not on file  . Highest education level: Not on file  Occupational History  . Not on file  Tobacco Use  . Smoking status: Never Smoker  . Smokeless tobacco: Never Used  Substance and Sexual Activity  . Alcohol use: Not on file  . Drug use: Not on file  . Sexual activity: Not on file  Other Topics Concern  . Not on file  Social History Narrative  . Not on file   Social Determinants of Health   Financial Resource Strain:   . Difficulty of Paying Living Expenses: Not on file  Food Insecurity:   . Worried About Charity fundraiser in the Last Year: Not on file  . Ran Out of Food in the Last Year: Not on file  Transportation Needs:   . Lack  of Transportation (Medical): Not on file  . Lack of Transportation (Non-Medical): Not on file  Physical Activity:   . Days of Exercise per Week: Not on file  . Minutes of Exercise per Session: Not on file  Stress:   . Feeling of Stress : Not on file  Social Connections:   . Frequency of Communication with Friends and Family: Not on file  . Frequency of Social Gatherings with Friends and Family: Not on file  . Attends Religious Services: Not on file  . Active Member of Clubs or Organizations: Not on file  . Attends Archivist Meetings: Not on file  . Marital Status: Not on file  Intimate Partner Violence:   . Fear of Current or Ex-Partner: Not on file  . Emotionally Abused: Not on file  . Physically Abused: Not on  file  . Sexually Abused: Not on file    Past Medical History, Surgical history, Social history, and Family history were reviewed and updated as appropriate.   Please see review of systems for further details on the patient's review from today.   Objective:   Physical Exam:  BP 116/71   Pulse 89   Physical Exam Constitutional:      General: He is not in acute distress. Musculoskeletal:        General: No deformity.  Neurological:     Mental Status: He is alert and oriented to person, place, and time.     Coordination: Coordination normal.  Psychiatric:        Attention and Perception: Attention and perception normal. He does not perceive auditory or visual hallucinations.        Mood and Affect: Mood normal. Mood is not anxious or depressed. Affect is not labile, blunt, angry or inappropriate.        Speech: Speech normal.        Behavior: Behavior normal.        Thought Content: Thought content normal. Thought content is not paranoid or delusional. Thought content does not include homicidal or suicidal ideation. Thought content does not include homicidal or suicidal plan.        Cognition and Memory: Cognition and memory normal.        Judgment: Judgment normal.     Comments: Insight intact     Lab Review:     Component Value Date/Time   NA 136 11/22/2011 1741   K 4.1 11/22/2011 1741   CL 105 11/22/2011 1741   CO2 25 11/22/2011 1741   GLUCOSE 74 11/22/2011 1741   BUN 26 (H) 11/22/2011 1741   CREATININE 1.05 11/22/2011 1741   CALCIUM 8.8 11/22/2011 1741   PROT 6.1 11/22/2011 1741   ALBUMIN 4.2 11/22/2011 1741   AST 20 11/22/2011 1741   ALT 17 11/22/2011 1741   ALKPHOS 68 11/22/2011 1741   BILITOT 0.7 11/22/2011 1741   GFRNONAA NOT CALCULATED 11/02/2009 1653   GFRAA  11/02/2009 1653    NOT CALCULATED        The eGFR has been calculated using the MDRD equation. This calculation has not been validated in all clinical situations. eGFR's persistently <60 mL/min  signify possible Chronic Kidney Disease.       Component Value Date/Time   WBC 5.7 03/11/2014 0937   WBC 4.3 11/22/2011 1741   RBC 4.95 03/11/2014 0937   RBC 4.13 (L) 11/22/2011 1741   HGB 16.3 03/11/2014 0937   HGB 14.0 11/22/2011 1741   HCT 48.2 03/11/2014 0937   HCT  39.3 11/22/2011 1741   PLT 276 11/22/2011 1741   MCV 97.4 (A) 03/11/2014 0937   MCH 33.0 (A) 03/11/2014 0937   MCH 33.9 11/22/2011 1741   MCHC 33.9 03/11/2014 0937   MCHC 35.6 11/22/2011 1741   RDW 12.9 11/22/2011 1741   LYMPHSABS 1.6 11/22/2011 1741   MONOABS 0.5 11/22/2011 1741   EOSABS 0.1 11/22/2011 1741   BASOSABS 0.0 11/22/2011 1741    No results found for: POCLITH, LITHIUM   No results found for: PHENYTOIN, PHENOBARB, VALPROATE, CBMZ   .res Assessment: Plan:    Plan:  PDMP reviewed  1. Continue Adderall XR 87m every morning  RTC 3 months  Patient advised to contact office with any questions, adverse effects, or acute worsening in signs and symptoms.  Discussed potential benefits, risks, and side effects of stimulants with patient to include increased heart rate, palpitations, insomnia, increased anxiety, increased irritability, or decreased appetite.  Instructed patient to contact office if experiencing any significant tolerability issues.     Diagnoses and all orders for this visit:  Attention deficit hyperactivity disorder (ADHD), unspecified ADHD type -     ADDERALL XR 20 MG 24 hr capsule; Take 1 capsule (20 mg total) by mouth daily. -     ADDERALL XR 20 MG 24 hr capsule; Take 1 capsule (20 mg total) by mouth daily. -     ADDERALL XR 20 MG 24 hr capsule; Take 1 capsule (20 mg total) by mouth daily.     Please see After Visit Summary for patient specific instructions.  Future Appointments  Date Time Provider DHerricks 10/28/2019 10:10 AM MMaximiano Coss [facility] 01/21/2020  8:20 AM Laquilla Dault, RBerdie Ogren NP CP-CP None    No orders of the defined types were  placed in this encounter.   -------------------------------

## 2019-10-28 ENCOUNTER — Ambulatory Visit: Payer: BLUE CROSS/BLUE SHIELD | Admitting: Registered Nurse

## 2019-10-30 ENCOUNTER — Encounter: Payer: Self-pay | Admitting: Registered Nurse

## 2020-01-21 ENCOUNTER — Ambulatory Visit: Payer: 59 | Admitting: Adult Health

## 2020-02-09 ENCOUNTER — Other Ambulatory Visit: Payer: Self-pay | Admitting: Adult Health

## 2020-02-09 ENCOUNTER — Telehealth: Payer: Self-pay | Admitting: Adult Health

## 2020-02-09 DIAGNOSIS — F909 Attention-deficit hyperactivity disorder, unspecified type: Secondary | ICD-10-CM

## 2020-02-09 MED ORDER — ADDERALL XR 20 MG PO CP24: 20.0000 mg | ORAL_CAPSULE | Freq: Every day | ORAL | 0 refills | Status: DC

## 2020-02-09 NOTE — Telephone Encounter (Signed)
Script sent  

## 2020-02-09 NOTE — Telephone Encounter (Signed)
Pt made an appt on 03/03/2020 and needs a refill on his adderall xr 20 mg [facility] [address]

## 2020-03-03 ENCOUNTER — Ambulatory Visit: Payer: 59 | Admitting: Adult Health

## 2020-03-17 ENCOUNTER — Encounter: Payer: Self-pay | Admitting: Adult Health

## 2020-03-17 ENCOUNTER — Other Ambulatory Visit: Payer: Self-pay

## 2020-03-17 ENCOUNTER — Ambulatory Visit (INDEPENDENT_AMBULATORY_CARE_PROVIDER_SITE_OTHER): Payer: 59 | Admitting: Adult Health

## 2020-03-17 DIAGNOSIS — F909 Attention-deficit hyperactivity disorder, unspecified type: Secondary | ICD-10-CM | POA: Diagnosis not present

## 2020-03-17 MED ORDER — ADDERALL XR 20 MG PO CP24: 20.0000 mg | ORAL_CAPSULE | Freq: Every day | ORAL | 0 refills | Status: DC

## 2020-03-17 NOTE — Progress Notes (Signed)
Damon Brown [id number] 1995-12-09 25 y.o.  Subjective:   Patient ID:  Damon Brown is a 25 y.o. (DOB 04-01-1995) male.  Chief Complaint: No chief complaint on file.   HPI Damon Brown presents to the office today for follow-up of ADHD.   Describes mood today as "ok". Pleasant. Mood symptoms - denies depression, anxiety, and irritability. Stating "I'm doing alright". Feels like the Adderall XR 20 continues to work well for him. Stable interest and motivation. Taking medications as prescribed.  Energy levels stable. 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 Single. Lives with a girlfriend of 2 years. Parents local and supportive. Spending time with family. Appetite adequate. Weight loss - 180 pounds.. Sleeps well most nights. Averages 7.5 to 8 hours. Focus and concentration improved. Completing tasks. Managing aspects of household. Work going well. Works at [employer] (8 to 4) and is pursuing a Restaurant manager, fast food.  Denies SI or HI.  Denies AH or VH.  [place] Office Visit from 07/21/2014 in Primary Care at [place] from 05/19/2014 in Primary Care at [facility] Total Score 0 0       Review of Systems:  Review of Systems  Musculoskeletal: Negative for gait problem.  Neurological: Negative for tremors.  Psychiatric/Behavioral:       Please refer to HPI    Medications: I have reviewed the patient's current medications.  Current Outpatient Medications  Medication Sig Dispense Refill  . ADDERALL XR 20 MG 24 hr capsule Take 1 capsule (20 mg total) by mouth daily. 30 capsule 0  . [START ON 04/14/2020] ADDERALL XR 20 MG 24 hr capsule Take 1 capsule (20 mg total) by mouth daily. 30 capsule 0  . [START ON 05/12/2020] ADDERALL XR 20 MG 24 hr capsule Take 1 capsule (20 mg total) by mouth daily. 30 capsule 0   No current facility-administered medications for this  visit.    Medication Side Effects: None  Allergies: No Known Allergies  Past Medical History:  Diagnosis Date  . Anxiety   . Depression     Family History  Problem Relation Age of Onset  . Drug abuse Father   . Cancer Maternal Grandmother        breast  . Cancer Maternal Grandfather        colon    Social History   Socioeconomic History  . Marital status: Single    Spouse name: Not on file  . Number of children: Not on file  . Years of education: Not on file  . Highest education level: Not on file  Occupational History  . Not on file  Tobacco Use  . Smoking status: Never Smoker  . Smokeless tobacco: Never Used  Substance and Sexual Activity  . Alcohol use: Not on file  . Drug use: Not on file  . Sexual activity: Not on file  Other Topics Concern  . Not on file  Social History Narrative  . Not on file   Social Determinants of Health   Financial Resource Strain: Not on file  Food Insecurity: Not on file  Transportation Needs: Not on file  Physical Activity: Not on file  Stress: Not on file  Social Connections: Not on file  Intimate Partner Violence: Not on file    Past Medical History, Surgical history, Social history, and Family history were reviewed and updated as appropriate.   Please  see review of systems for further details on the patient's review from today.   Objective:   Physical Exam:  There were no vitals taken for this visit.  Physical Exam Constitutional:      General: He is not in acute distress. Musculoskeletal:        General: No deformity.  Neurological:     Mental Status: He is alert and oriented to person, place, and time.     Coordination: Coordination normal.  Psychiatric:        Attention and Perception: Attention and perception normal. He does not perceive auditory or visual hallucinations.        Mood and Affect: Mood normal. Mood is not anxious or depressed. Affect is not labile, blunt, angry or inappropriate.         Speech: Speech normal.        Behavior: Behavior normal.        Thought Content: Thought content normal. Thought content is not paranoid or delusional. Thought content does not include homicidal or suicidal ideation. Thought content does not include homicidal or suicidal plan.        Cognition and Memory: Cognition and memory normal.        Judgment: Judgment normal.     Comments: Insight intact     Lab Review:     Component Value Date/Time   NA 136 11/22/2011 1741   K 4.1 11/22/2011 1741   CL 105 11/22/2011 1741   CO2 25 11/22/2011 1741   GLUCOSE 74 11/22/2011 1741   BUN 26 (H) 11/22/2011 1741   CREATININE 1.05 11/22/2011 1741   CALCIUM 8.8 11/22/2011 1741   PROT 6.1 11/22/2011 1741   ALBUMIN 4.2 11/22/2011 1741   AST 20 11/22/2011 1741   ALT 17 11/22/2011 1741   ALKPHOS 68 11/22/2011 1741   BILITOT 0.7 11/22/2011 1741   GFRNONAA NOT CALCULATED 11/02/2009 1653   GFRAA  11/02/2009 1653    NOT CALCULATED        The eGFR has been calculated using the MDRD equation. This calculation has not been validated in all clinical situations. eGFR's persistently <60 mL/min signify possible Chronic Kidney Disease.       Component Value Date/Time   WBC 5.7 03/11/2014 0937   WBC 4.3 11/22/2011 1741   RBC 4.95 03/11/2014 0937   RBC 4.13 (L) 11/22/2011 1741   HGB 16.3 03/11/2014 0937   HGB 14.0 11/22/2011 1741   HCT 48.2 03/11/2014 0937   HCT 39.3 11/22/2011 1741   PLT 276 11/22/2011 1741   MCV 97.4 (A) 03/11/2014 0937   MCH 33.0 (A) 03/11/2014 0937   MCH 33.9 11/22/2011 1741   MCHC 33.9 03/11/2014 0937   MCHC 35.6 11/22/2011 1741   RDW 12.9 11/22/2011 1741   LYMPHSABS 1.6 11/22/2011 1741   MONOABS 0.5 11/22/2011 1741   EOSABS 0.1 11/22/2011 1741   BASOSABS 0.0 11/22/2011 1741    No results found for: POCLITH, LITHIUM   No results found for: PHENYTOIN, PHENOBARB, VALPROATE, CBMZ   .res Assessment: Plan:    Plan:  PDMP reviewed  1. Continue Adderall XR 49m every  morning  RTC 6 months  Patient advised to contact office with any questions, adverse effects, or acute worsening in signs and symptoms.  Discussed potential benefits, risks, and side effects of stimulants with patient to include increased heart rate, palpitations, insomnia, increased anxiety, increased irritability, or decreased appetite.  Instructed patient to contact office if experiencing any significant tolerability issues.   Diagnoses  and all orders for this visit:  Attention deficit hyperactivity disorder (ADHD), unspecified ADHD type -     ADDERALL XR 20 MG 24 hr capsule; Take 1 capsule (20 mg total) by mouth daily. -     ADDERALL XR 20 MG 24 hr capsule; Take 1 capsule (20 mg total) by mouth daily. -     ADDERALL XR 20 MG 24 hr capsule; Take 1 capsule (20 mg total) by mouth daily.     Please see After Visit Summary for patient specific instructions.  No future appointments.  No orders of the defined types were placed in this encounter.   -------------------------------

## 2020-06-21 ENCOUNTER — Other Ambulatory Visit: Payer: Self-pay

## 2020-06-21 ENCOUNTER — Telehealth: Payer: Self-pay | Admitting: Adult Health

## 2020-06-21 DIAGNOSIS — F909 Attention-deficit hyperactivity disorder, unspecified type: Secondary | ICD-10-CM

## 2020-06-21 NOTE — Telephone Encounter (Signed)
Pt would like a refill on Adderall. [facility] [address] 

## 2020-06-21 NOTE — Telephone Encounter (Signed)
Pended 3 Rx's for [facility] to review and sign. Last refill was 05/17/20 Next apt is August

## 2020-06-23 MED ORDER — ADDERALL XR 20 MG PO CP24: 20.0000 mg | ORAL_CAPSULE | Freq: Every day | ORAL | 0 refills | Status: DC

## 2020-06-24 ENCOUNTER — Encounter: Payer: Self-pay | Admitting: Adult Health

## 2020-09-08 ENCOUNTER — Telehealth: Payer: Self-pay | Admitting: Adult Health

## 2020-09-08 ENCOUNTER — Other Ambulatory Visit: Payer: Self-pay

## 2020-09-08 DIAGNOSIS — F909 Attention-deficit hyperactivity disorder, unspecified type: Secondary | ICD-10-CM

## 2020-09-08 NOTE — Telephone Encounter (Signed)
Damon Brown called to request refill of his Adderall but he needs it to go to a new pharmacy.  [facility]  Please send new prescriptions of [facility] on [address] in [place]. Appt 8/16

## 2020-09-08 NOTE — Telephone Encounter (Signed)
Pended.

## 2020-09-13 MED ORDER — ADDERALL XR 20 MG PO CP24: 20.0000 mg | ORAL_CAPSULE | Freq: Every day | ORAL | 0 refills | Status: DC

## 2020-09-14 ENCOUNTER — Ambulatory Visit: Payer: 59 | Admitting: Adult Health

## 2020-10-21 ENCOUNTER — Other Ambulatory Visit: Payer: Self-pay

## 2020-10-21 ENCOUNTER — Ambulatory Visit (INDEPENDENT_AMBULATORY_CARE_PROVIDER_SITE_OTHER): Payer: 59 | Admitting: Adult Health

## 2020-10-21 ENCOUNTER — Encounter: Payer: Self-pay | Admitting: Adult Health

## 2020-10-21 DIAGNOSIS — F909 Attention-deficit hyperactivity disorder, unspecified type: Secondary | ICD-10-CM | POA: Diagnosis not present

## 2020-10-21 MED ORDER — ADDERALL XR 20 MG PO CP24: 20.0000 mg | ORAL_CAPSULE | Freq: Every day | ORAL | 0 refills | Status: DC

## 2020-10-21 NOTE — Progress Notes (Signed)
Damon Brown [id number] 09-29-1995 25 y.o.  Subjective:   Patient ID:  Damon Brown is a 25 y.o. (DOB [birth year]) male.  Chief Complaint: No chief complaint on file.   HPI Damon Brown presents to the office today for follow-up of ADHD.   Describes mood today as "ok". Pleasant. Mood symptoms - denies depression, anxiety, and irritability. Stating "I'm doing alright". Feels like the Adderall XR 20 continues to work well for him. Stable interest and motivation. Taking medications as prescribed.  Energy levels stable. 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 Lives with a girlfriend of 3 years- dog and cat. Parents local and supportive. Spending time with family. Appetite adequate. Weight gain - 180 to 190 pounds.. Sleeps well most nights. Averages 7.5 to 8 hours. Focus and concentration improved. Completing tasks. Managing aspects of household. Work going well. Works at [employer] (8 to 4) and is pursuing a Restaurant manager, fast food. Started classes at [employer].  Denies SI or HI.  Denies AH or VH.   [place] Office Visit from 07/21/2014 in Primary Care at [place] from 05/19/2014 in Primary Care at [facility] Total Score 0 0        Review of Systems:  Review of Systems  Musculoskeletal:  Negative for gait problem.  Neurological:  Negative for tremors.  Psychiatric/Behavioral:         Please refer to HPI   Medications: I have reviewed the patient's current medications.  Current Outpatient Medications  Medication Sig Dispense Refill   ADDERALL XR 20 MG 24 hr capsule Take 1 capsule (20 mg total) by mouth daily. 30 capsule 0   ADDERALL XR 20 MG 24 hr capsule Take 1 capsule (20 mg total) by mouth daily. 30 capsule 0   ADDERALL XR 20 MG 24 hr capsule Take 1 capsule (20 mg total) by mouth daily. 30 capsule 0   No current facility-administered medications for  this visit.    Medication Side Effects: None  Allergies: No Known Allergies  Past Medical History:  Diagnosis Date   Anxiety    Depression     Past Medical History, Surgical history, Social history, and Family history were reviewed and updated as appropriate.   Please see review of systems for further details on the patient's review from today.   Objective:   Physical Exam:  There were no vitals taken for this visit.  Physical Exam Constitutional:      General: He is not in acute distress. Musculoskeletal:        General: No deformity.  Neurological:     Mental Status: He is alert and oriented to person, place, and time.     Coordination: Coordination normal.  Psychiatric:        Attention and Perception: Attention and perception normal. He does not perceive auditory or visual hallucinations.        Mood and Affect: Mood normal. Mood is not anxious or depressed. Affect is not labile, blunt, angry or inappropriate.        Speech: Speech normal.        Behavior: Behavior normal.        Thought Content: Thought content normal. Thought content is not paranoid or delusional. Thought content does not include homicidal or suicidal ideation. Thought content does not include homicidal or suicidal plan.        Cognition  and Memory: Cognition and memory normal.        Judgment: Judgment normal.     Comments: Insight intact    Lab Review:     Component Value Date/Time   NA 136 11/22/2011 1741   K 4.1 11/22/2011 1741   CL 105 11/22/2011 1741   CO2 25 11/22/2011 1741   GLUCOSE 74 11/22/2011 1741   BUN 26 (H) 11/22/2011 1741   CREATININE 1.05 11/22/2011 1741   CALCIUM 8.8 11/22/2011 1741   PROT 6.1 11/22/2011 1741   ALBUMIN 4.2 11/22/2011 1741   AST 20 11/22/2011 1741   ALT 17 11/22/2011 1741   ALKPHOS 68 11/22/2011 1741   BILITOT 0.7 11/22/2011 1741   GFRNONAA NOT CALCULATED 11/02/2009 1653   GFRAA  11/02/2009 1653    NOT CALCULATED        The eGFR has been  calculated using the MDRD equation. This calculation has not been validated in all clinical situations. eGFR's persistently <60 mL/min signify possible Chronic Kidney Disease.       Component Value Date/Time   WBC 5.7 03/11/2014 0937   WBC 4.3 11/22/2011 1741   RBC 4.95 03/11/2014 0937   RBC 4.13 (L) 11/22/2011 1741   HGB 16.3 03/11/2014 0937   HGB 14.0 11/22/2011 1741   HCT 48.2 03/11/2014 0937   HCT 39.3 11/22/2011 1741   PLT 276 11/22/2011 1741   MCV 97.4 (A) 03/11/2014 0937   MCH 33.0 (A) 03/11/2014 0937   MCH 33.9 11/22/2011 1741   MCHC 33.9 03/11/2014 0937   MCHC 35.6 11/22/2011 1741   RDW 12.9 11/22/2011 1741   LYMPHSABS 1.6 11/22/2011 1741   MONOABS 0.5 11/22/2011 1741   EOSABS 0.1 11/22/2011 1741   BASOSABS 0.0 11/22/2011 1741    No results found for: POCLITH, LITHIUM   No results found for: PHENYTOIN, PHENOBARB, VALPROATE, CBMZ   .res Assessment: Plan:    Plan:  PDMP reviewed  1. Continue Adderall XR 72m every morning  119/77/69  RTC 6 months  Patient advised to contact office with any questions, adverse effects, or acute worsening in signs and symptoms.  Discussed potential benefits, risks, and side effects of stimulants with patient to include increased heart rate, palpitations, insomnia, increased anxiety, increased irritability, or decreased appetite.  Instructed patient to contact office if experiencing any significant tolerability issues.  There are no diagnoses linked to this encounter.   Please see After Visit Summary for patient specific instructions.  No future appointments.  No orders of the defined types were placed in this encounter.   -------------------------------

## 2021-01-20 ENCOUNTER — Ambulatory Visit: Payer: 59 | Admitting: Adult Health

## 2021-03-11 ENCOUNTER — Other Ambulatory Visit: Payer: Self-pay

## 2021-03-11 ENCOUNTER — Encounter: Payer: Self-pay | Admitting: Adult Health

## 2021-03-11 ENCOUNTER — Ambulatory Visit (INDEPENDENT_AMBULATORY_CARE_PROVIDER_SITE_OTHER): Payer: 59 | Admitting: Adult Health

## 2021-03-11 DIAGNOSIS — F909 Attention-deficit hyperactivity disorder, unspecified type: Secondary | ICD-10-CM

## 2021-03-11 MED ORDER — ADZENYS XR-ODT 12.5 MG PO TBED: 12.5000 mg | EXTENDED_RELEASE_TABLET | Freq: Every morning | ORAL | 0 refills | Status: DC

## 2021-03-11 NOTE — Progress Notes (Signed)
Damon Brown [id number] [birth year] 26 y.o.  Subjective:   Patient ID:  Damon Brown is a 26 y.o. (DOB [birth year]) male.  Chief Complaint: No chief complaint on file.   HPI Damon Brown presents to the office today for follow-up of ADHD.   Describes mood today as "ok". Pleasant. Denies tearfulness. Mood symptoms - denies depression, anxiety, and irritability. Stating "I'm doing alright". Feels like medication continues to work well for him. Stable interest and motivation. Taking medications as prescribed.  Energy levels stable. 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 Lives with a girlfriend of 3 years- dog and cat. Parents local and supportive. Spending time with family. Appetite adequate. Weight gain - 180 to 190 pounds.. Sleeps well most nights. Averages 7.5 to 8 hours. Focus and concentration improved. Completing tasks. Managing aspects of household. Work going well. Works at [employer] (8 to 4) and is pursuing a Restaurant manager, fast food. Working on Programmer, multimedia. Denies SI or HI.  Denies AH or VH.   [place] Office Visit from 07/21/2014 in Primary Care at [place] from 05/19/2014 in Primary Care at [facility] Total Score 0 0        Review of Systems:  Review of Systems  Medications: I have reviewed the patient's current medications.  Current Outpatient Medications  Medication Sig Dispense Refill   Amphetamine ER (ADZENYS XR-ODT) 12.5 MG TBED Take 12.5 mg by mouth in the morning. 30 tablet 0   ADDERALL XR 20 MG 24 hr capsule Take 1 capsule (20 mg total) by mouth daily. 30 capsule 0   ADDERALL XR 20 MG 24 hr capsule Take 1 capsule (20 mg total) by mouth daily. 30 capsule 0   ADDERALL XR 20 MG 24 hr capsule Take 1 capsule (20 mg total) by mouth daily. 30 capsule 0   No current facility-administered medications for this visit.     Medication Side Effects: None  Allergies: No Known Allergies  Past Medical History:  Diagnosis Date   Anxiety    Depression     Past Medical History, Surgical history, Social history, and Family history were reviewed and updated as appropriate.   Please see review of systems for further details on the patient's review from today.   Objective:   Physical Exam:  There were no vitals taken for this visit.  Physical Exam  Lab Review:     Component Value Date/Time   NA 136 11/22/2011 1741   K 4.1 11/22/2011 1741   CL 105 11/22/2011 1741   CO2 25 11/22/2011 1741   GLUCOSE 74 11/22/2011 1741   BUN 26 (H) 11/22/2011 1741   CREATININE 1.05 11/22/2011 1741   CALCIUM 8.8 11/22/2011 1741   PROT 6.1 11/22/2011 1741   ALBUMIN 4.2 11/22/2011 1741   AST 20 11/22/2011 1741   ALT 17 11/22/2011 1741   ALKPHOS 68 11/22/2011 1741   BILITOT 0.7 11/22/2011 1741   GFRNONAA NOT CALCULATED 11/02/2009 1653   GFRAA  11/02/2009 1653    NOT CALCULATED        The eGFR has been calculated using the MDRD equation. This calculation has not been validated in all clinical situations. eGFR's persistently <60 mL/min signify possible Chronic Kidney Disease.       Component Value Date/Time   WBC 5.7 03/11/2014 0937   WBC 4.3 11/22/2011 1741   RBC 4.95 03/11/2014  0937   RBC 4.13 (L) 11/22/2011 1741   HGB 16.3 03/11/2014 0937   HGB 14.0 11/22/2011 1741   HCT 48.2 03/11/2014 0937   HCT 39.3 11/22/2011 1741   PLT 276 11/22/2011 1741   MCV 97.4 (A) 03/11/2014 0937   MCH 33.0 (A) 03/11/2014 0937   MCH 33.9 11/22/2011 1741   MCHC 33.9 03/11/2014 0937   MCHC 35.6 11/22/2011 1741   RDW 12.9 11/22/2011 1741   LYMPHSABS 1.6 11/22/2011 1741   MONOABS 0.5 11/22/2011 1741   EOSABS 0.1 11/22/2011 1741   BASOSABS 0.0 11/22/2011 1741    No results found for: POCLITH, LITHIUM   No results found for: PHENYTOIN, PHENOBARB, VALPROATE, CBMZ   .res Assessment: Plan:     Plan:  PDMP  reviewed  1. Continue Adderall XR 53m every morning  119/77/69  RTC 6 months  Patient advised to contact office with any questions, adverse effects, or acute worsening in signs and symptoms.  Discussed potential benefits, risks, and side effects of stimulants with patient to include increased heart rate, palpitations, insomnia, increased anxiety, increased irritability, or decreased appetite.  Instructed patient to contact office if experiencing any significant tolerability issues.  Diagnoses and all orders for this visit:  Attention deficit hyperactivity disorder (ADHD), unspecified ADHD type -     Amphetamine ER (ADZENYS XR-ODT) 12.5 MG TBED; Take 12.5 mg by mouth in the morning.     Please see After Visit Summary for patient specific instructions.  No future appointments.  No orders of the defined types were placed in this encounter.   -------------------------------

## 2021-04-04 ENCOUNTER — Other Ambulatory Visit: Payer: Self-pay

## 2021-04-04 ENCOUNTER — Telehealth: Payer: Self-pay | Admitting: Adult Health

## 2021-04-04 DIAGNOSIS — F909 Attention-deficit hyperactivity disorder, unspecified type: Secondary | ICD-10-CM

## 2021-04-04 MED ORDER — ADDERALL XR 20 MG PO CP24: 20.0000 mg | ORAL_CAPSULE | Freq: Every day | ORAL | 0 refills | Status: DC

## 2021-04-04 NOTE — Telephone Encounter (Signed)
Pt requesting Rx for Adderall @ [facility] [address]. Apt 8/10 ?

## 2021-04-04 NOTE — Telephone Encounter (Signed)
Pended.

## 2021-05-18 ENCOUNTER — Telehealth: Payer: Self-pay | Admitting: Adult Health

## 2021-05-18 ENCOUNTER — Other Ambulatory Visit: Payer: Self-pay | Admitting: Adult Health

## 2021-05-18 DIAGNOSIS — F909 Attention-deficit hyperactivity disorder, unspecified type: Secondary | ICD-10-CM

## 2021-05-18 MED ORDER — ADDERALL XR 20 MG PO CP24: 20.0000 mg | ORAL_CAPSULE | Freq: Every day | ORAL | 0 refills | Status: DC

## 2021-05-18 NOTE — Telephone Encounter (Signed)
Script sent  

## 2021-05-18 NOTE — Telephone Encounter (Signed)
RF Adderall XR ? ?Send to [facility] on [address], [place] ? ? ? ? ?

## 2021-06-21 ENCOUNTER — Telehealth: Payer: Self-pay | Admitting: Adult Health

## 2021-06-21 ENCOUNTER — Other Ambulatory Visit: Payer: Self-pay | Admitting: Adult Health

## 2021-06-21 DIAGNOSIS — F909 Attention-deficit hyperactivity disorder, unspecified type: Secondary | ICD-10-CM

## 2021-06-21 MED ORDER — ADDERALL XR 20 MG PO CP24: 20.0000 mg | ORAL_CAPSULE | Freq: Every day | ORAL | 0 refills | Status: DC

## 2021-06-21 NOTE — Telephone Encounter (Signed)
Script sent  

## 2021-06-21 NOTE — Telephone Encounter (Signed)
Clyde called this morning at 10:30am to request refill of his Adderall.  Appt 09/08/21.  Send to [facility] on [address].  He said they have it.

## 2021-08-09 ENCOUNTER — Other Ambulatory Visit: Payer: Self-pay

## 2021-08-09 ENCOUNTER — Telehealth: Payer: Self-pay | Admitting: Adult Health

## 2021-08-09 DIAGNOSIS — F909 Attention-deficit hyperactivity disorder, unspecified type: Secondary | ICD-10-CM

## 2021-08-09 MED ORDER — ADDERALL XR 20 MG PO CP24: 20.0000 mg | ORAL_CAPSULE | Freq: Every day | ORAL | 0 refills | Status: DC

## 2021-08-09 NOTE — Telephone Encounter (Signed)
Pended.

## 2021-08-09 NOTE — Telephone Encounter (Signed)
Pt is requesting refill of Adderall to [facility] [address]. It is available there.  Next appt 8/10

## 2021-09-08 ENCOUNTER — Ambulatory Visit (INDEPENDENT_AMBULATORY_CARE_PROVIDER_SITE_OTHER): Payer: 59 | Admitting: Adult Health

## 2021-09-08 ENCOUNTER — Encounter: Payer: Self-pay | Admitting: Adult Health

## 2021-09-08 DIAGNOSIS — F909 Attention-deficit hyperactivity disorder, unspecified type: Secondary | ICD-10-CM | POA: Diagnosis not present

## 2021-09-08 MED ORDER — ADDERALL XR 20 MG PO CP24: 20.0000 mg | ORAL_CAPSULE | Freq: Every day | ORAL | 0 refills | Status: DC

## 2021-09-08 NOTE — Progress Notes (Signed)
Damon Brown [id number] 11/04/1995 26 y.o.  Subjective:   Patient ID:  Damon Brown is a 26 y.o. (DOB [birth year]) male.  Chief Complaint: No chief complaint on file.   HPI Damon Brown presents to the office today for follow-up of ADHD.   Describes mood today as "ok". Pleasant. Denies tearfulness. Mood symptoms - denies depression, anxiety, and irritability. Mood is consistent. Stating "I'm doing good". Feels like medication continues to work well for him. Stable interest and motivation. Taking medications as prescribed.  Energy levels stable. 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 Lives with a girlfriend - dog and cat. Parents local and supportive. Spending time with family. Appetite adequate. Weight gain - 180 to 190 pounds.. Sleeps well most nights. Averages 7.5 to 8 hours. Focus and concentration stable. Completing tasks. Managing aspects of household. Work going well. Works at [employer] (8 to 4) and is pursuing a Restaurant manager, fast food. Working on Programmer, multimedia. Denies SI or HI.  Denies AH or VH.   [place] Office Visit from 07/21/2014 in Primary Care at [place] from 05/19/2014 in Primary Care at [facility] Total Score 0 0        Review of Systems:  Review of Systems  Musculoskeletal:  Negative for gait problem.  Neurological:  Negative for tremors.  Psychiatric/Behavioral:         Please refer to HPI    Medications: I have reviewed the patient's current medications.  Current Outpatient Medications  Medication Sig Dispense Refill   ADDERALL XR 20 MG 24 hr capsule Take 1 capsule (20 mg total) by mouth daily. 30 capsule 0   [START ON 10/06/2021] ADDERALL XR 20 MG 24 hr capsule Take 1 capsule (20 mg total) by mouth daily. 30 capsule 0   [START ON 11/03/2021] ADDERALL XR 20 MG 24 hr capsule Take 1 capsule (20 mg total) by mouth daily.  30 capsule 0   No current facility-administered medications for this visit.    Medication Side Effects: None  Allergies: No Known Allergies  Past Medical History:  Diagnosis Date   Anxiety    Depression     Past Medical History, Surgical history, Social history, and Family history were reviewed and updated as appropriate.   Please see review of systems for further details on the patient's review from today.   Objective:   Physical Exam:  There were no vitals taken for this visit.  Physical Exam Constitutional:      General: He is not in acute distress. Musculoskeletal:        General: No deformity.  Neurological:     Mental Status: He is alert and oriented to person, place, and time.     Coordination: Coordination normal.  Psychiatric:        Attention and Perception: Attention and perception normal. He does not perceive auditory or visual hallucinations.        Mood and Affect: Mood normal. Mood is not anxious or depressed. Affect is not labile, blunt, angry or inappropriate.        Speech: Speech normal.        Behavior: Behavior normal.        Thought Content: Thought content normal. Thought content is not paranoid or delusional. Thought content does not include homicidal or suicidal ideation. Thought content does not include homicidal or suicidal plan.  Cognition and Memory: Cognition and memory normal.        Judgment: Judgment normal.     Comments: Insight intact     Lab Review:     Component Value Date/Time   NA 136 11/22/2011 1741   K 4.1 11/22/2011 1741   CL 105 11/22/2011 1741   CO2 25 11/22/2011 1741   GLUCOSE 74 11/22/2011 1741   BUN 26 (H) 11/22/2011 1741   CREATININE 1.05 11/22/2011 1741   CALCIUM 8.8 11/22/2011 1741   PROT 6.1 11/22/2011 1741   ALBUMIN 4.2 11/22/2011 1741   AST 20 11/22/2011 1741   ALT 17 11/22/2011 1741   ALKPHOS 68 11/22/2011 1741   BILITOT 0.7 11/22/2011 1741   GFRNONAA NOT CALCULATED 11/02/2009 1653   GFRAA   11/02/2009 1653    NOT CALCULATED        The eGFR has been calculated using the MDRD equation. This calculation has not been validated in all clinical situations. eGFR's persistently <60 mL/min signify possible Chronic Kidney Disease.       Component Value Date/Time   WBC 5.7 03/11/2014 0937   WBC 4.3 11/22/2011 1741   RBC 4.95 03/11/2014 0937   RBC 4.13 (L) 11/22/2011 1741   HGB 16.3 03/11/2014 0937   HGB 14.0 11/22/2011 1741   HCT 48.2 03/11/2014 0937   HCT 39.3 11/22/2011 1741   PLT 276 11/22/2011 1741   MCV 97.4 (A) 03/11/2014 0937   MCH 33.0 (A) 03/11/2014 0937   MCH 33.9 11/22/2011 1741   MCHC 33.9 03/11/2014 0937   MCHC 35.6 11/22/2011 1741   RDW 12.9 11/22/2011 1741   LYMPHSABS 1.6 11/22/2011 1741   MONOABS 0.5 11/22/2011 1741   EOSABS 0.1 11/22/2011 1741   BASOSABS 0.0 11/22/2011 1741    No results found for: "POCLITH", "LITHIUM"   No results found for: "PHENYTOIN", "PHENOBARB", "VALPROATE", "CBMZ"   .res Assessment: Plan:    Plan:  PDMP reviewed  1. Continue Adderall XR 97m every morning  121/76/72  RTC 6 months  Patient advised to contact office with any questions, adverse effects, or acute worsening in signs and symptoms.  Discussed potential benefits, risks, and side effects of stimulants with patient to include increased heart rate, palpitations, insomnia, increased anxiety, increased irritability, or decreased appetite.  Instructed patient to contact office if experiencing any significant tolerability issues.  Diagnoses and all orders for this visit:  Attention deficit hyperactivity disorder (ADHD), unspecified ADHD type -     ADDERALL XR 20 MG 24 hr capsule; Take 1 capsule (20 mg total) by mouth daily. -     ADDERALL XR 20 MG 24 hr capsule; Take 1 capsule (20 mg total) by mouth daily. -     ADDERALL XR 20 MG 24 hr capsule; Take 1 capsule (20 mg total) by mouth daily.     Please see After Visit Summary for patient specific  instructions.  No future appointments.  No orders of the defined types were placed in this encounter.   -------------------------------

## 2022-03-10 ENCOUNTER — Ambulatory Visit (INDEPENDENT_AMBULATORY_CARE_PROVIDER_SITE_OTHER): Payer: Self-pay | Admitting: Adult Health

## 2022-03-10 DIAGNOSIS — F489 Nonpsychotic mental disorder, unspecified: Secondary | ICD-10-CM

## 2022-03-10 NOTE — Progress Notes (Signed)
Patient ID: JB KODA, male   DOB: 12-Sep-1995, 27 y.o.   MRN: [id number]  Patient no show appointment.

## 2022-11-23 DIAGNOSIS — F4322 Adjustment disorder with anxiety: Secondary | ICD-10-CM | POA: Diagnosis not present

## 2022-11-27 DIAGNOSIS — F4322 Adjustment disorder with anxiety: Secondary | ICD-10-CM | POA: Diagnosis not present

## 2022-11-30 DIAGNOSIS — F4322 Adjustment disorder with anxiety: Secondary | ICD-10-CM | POA: Diagnosis not present

## 2022-12-05 DIAGNOSIS — F4322 Adjustment disorder with anxiety: Secondary | ICD-10-CM | POA: Diagnosis not present

## 2022-12-12 DIAGNOSIS — F4322 Adjustment disorder with anxiety: Secondary | ICD-10-CM | POA: Diagnosis not present

## 2022-12-18 DIAGNOSIS — F4321 Adjustment disorder with depressed mood: Secondary | ICD-10-CM | POA: Diagnosis not present

## 2022-12-19 DIAGNOSIS — F4322 Adjustment disorder with anxiety: Secondary | ICD-10-CM | POA: Diagnosis not present

## 2022-12-25 DIAGNOSIS — F4321 Adjustment disorder with depressed mood: Secondary | ICD-10-CM | POA: Diagnosis not present

## 2023-01-01 DIAGNOSIS — F4321 Adjustment disorder with depressed mood: Secondary | ICD-10-CM | POA: Diagnosis not present

## 2023-01-02 DIAGNOSIS — F4322 Adjustment disorder with anxiety: Secondary | ICD-10-CM | POA: Diagnosis not present

## 2023-01-10 DIAGNOSIS — F4322 Adjustment disorder with anxiety: Secondary | ICD-10-CM | POA: Diagnosis not present

## 2023-01-12 DIAGNOSIS — H52203 Unspecified astigmatism, bilateral: Secondary | ICD-10-CM | POA: Diagnosis not present

## 2023-01-12 DIAGNOSIS — H5213 Myopia, bilateral: Secondary | ICD-10-CM | POA: Diagnosis not present

## 2023-01-15 DIAGNOSIS — F4321 Adjustment disorder with depressed mood: Secondary | ICD-10-CM | POA: Diagnosis not present

## 2023-01-29 DIAGNOSIS — F4322 Adjustment disorder with anxiety: Secondary | ICD-10-CM | POA: Diagnosis not present

## 2023-02-12 DIAGNOSIS — F4321 Adjustment disorder with depressed mood: Secondary | ICD-10-CM | POA: Diagnosis not present

## 2023-02-13 DIAGNOSIS — F4322 Adjustment disorder with anxiety: Secondary | ICD-10-CM | POA: Diagnosis not present

## 2023-02-27 DIAGNOSIS — F4322 Adjustment disorder with anxiety: Secondary | ICD-10-CM | POA: Diagnosis not present

## 2023-02-28 DIAGNOSIS — Z1389 Encounter for screening for other disorder: Secondary | ICD-10-CM | POA: Diagnosis not present

## 2023-02-28 DIAGNOSIS — D229 Melanocytic nevi, unspecified: Secondary | ICD-10-CM | POA: Diagnosis not present

## 2023-02-28 DIAGNOSIS — L918 Other hypertrophic disorders of the skin: Secondary | ICD-10-CM | POA: Diagnosis not present

## 2023-03-12 DIAGNOSIS — F4322 Adjustment disorder with anxiety: Secondary | ICD-10-CM | POA: Diagnosis not present

## 2023-03-14 ENCOUNTER — Ambulatory Visit: Payer: 59 | Admitting: Adult Health

## 2023-03-14 ENCOUNTER — Encounter: Payer: Self-pay | Admitting: Adult Health

## 2023-03-14 DIAGNOSIS — F909 Attention-deficit hyperactivity disorder, unspecified type: Secondary | ICD-10-CM

## 2023-03-14 MED ORDER — AMPHETAMINE-DEXTROAMPHET ER 10 MG PO CP24: 10.0000 mg | ORAL_CAPSULE | Freq: Every day | ORAL | 0 refills | Status: DC

## 2023-03-14 NOTE — Progress Notes (Signed)
Damon Brown [id number] 12/31/1995 28 y.o.  Subjective:   Patient ID:  Damon Brown is a 28 y.o. (DOB 1995/03/15) male.  Chief Complaint: No chief complaint on file.   HPI Damon Brown presents to the office today for follow-up of ADHD.   Describes mood today as "ok". Pleasant. Denies tearfulness. Mood symptoms - denies depression, anxiety, and irritability. Denies panic attacks. Denies worry, rumination and over thinking. Mood is consistent. Stating "I feel like I'm doing good". Stopped ADD medications a few years ago and would like to restart. Stable interest and motivation. Taking medications as prescribed.  Energy levels stable. 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 Enjoys some usual interests and activities. Lives with a girlfriend - dog and 2 cats. Parents local and supportive. Spending time with family. Appetite adequate. Weight gain -200 pounds. Sleeps well most nights. Averages 7.5 to 8 hours. Focus and concentration stable. Completing tasks. Managing aspects of household. Work going well - trading - Chiropodist.  Denies SI or HI.  Denies AH or VH. Denies self harm. Denies substance use.   PHQ2-9    Flowsheet Row Office Visit from 07/21/2014 in Primary Care at [facility] Visit from 05/19/2014 in Primary Care at [facility] Total Score 0 0        Review of Systems:  Review of Systems  Medications: I have reviewed the patient's current medications.  Current Outpatient Medications  Medication Sig Dispense Refill   ADDERALL XR 20 MG 24 hr capsule Take 1 capsule (20 mg total) by mouth daily. 30 capsule 0   ADDERALL XR 20 MG 24 hr capsule Take 1 capsule (20 mg total) by mouth daily. 30 capsule 0   ADDERALL XR 20 MG 24 hr capsule Take 1 capsule (20 mg total) by mouth daily. 30 capsule 0   No current facility-administered medications for this visit.    Medication Side Effects: None  Allergies:  No Known Allergies  Past Medical History:  Diagnosis Date   Anxiety    Depression     Past Medical History, Surgical history, Social history, and Family history were reviewed and updated as appropriate.   Please see review of systems for further details on the patient's review from today.   Objective:   Physical Exam:  There were no vitals taken for this visit.  Physical Exam  Lab Review:     Component Value Date/Time   NA 136 11/22/2011 1741   K 4.1 11/22/2011 1741   CL 105 11/22/2011 1741   CO2 25 11/22/2011 1741   GLUCOSE 74 11/22/2011 1741   BUN 26 (H) 11/22/2011 1741   CREATININE 1.05 11/22/2011 1741   CALCIUM 8.8 11/22/2011 1741   PROT 6.1 11/22/2011 1741   ALBUMIN 4.2 11/22/2011 1741   AST 20 11/22/2011 1741   ALT 17 11/22/2011 1741   ALKPHOS 68 11/22/2011 1741   BILITOT 0.7 11/22/2011 1741   GFRNONAA NOT CALCULATED 11/02/2009 1653   GFRAA  11/02/2009 1653    NOT CALCULATED        The eGFR has been calculated using the MDRD equation. This calculation has not been validated in all clinical situations. eGFR's persistently <60 mL/min signify possible Chronic Kidney Disease.       Component Value Date/Time   WBC 5.7 03/11/2014 0937   WBC 4.3 11/22/2011 1741   RBC 4.95 03/11/2014 0937   RBC 4.13 (L) 11/22/2011 1741   HGB  16.3 03/11/2014 0937   HGB 14.0 11/22/2011 1741   HCT 48.2 03/11/2014 0937   HCT 39.3 11/22/2011 1741   PLT 276 11/22/2011 1741   MCV 97.4 (A) 03/11/2014 0937   MCH 33.0 (A) 03/11/2014 0937   MCH 33.9 11/22/2011 1741   MCHC 33.9 03/11/2014 0937   MCHC 35.6 11/22/2011 1741   RDW 12.9 11/22/2011 1741   LYMPHSABS 1.6 11/22/2011 1741   MONOABS 0.5 11/22/2011 1741   EOSABS 0.1 11/22/2011 1741   BASOSABS 0.0 11/22/2011 1741    No results found for: "POCLITH", "LITHIUM"   No results found for: "PHENYTOIN", "PHENOBARB", "VALPROATE", "CBMZ"   .res Assessment: Plan:    Plan:  PDMP reviewed  Restart Adderall XR 10mg  every  morning - will call in a month to increase or continue current dose - will send in 3 scripts.  RTC 6 months  15 minutes spent dedicated to the care of this patient on the date of this encounter to include pre-visit review of records, ordering of medication, post visit documentation, and face-to-face time with the patient discussing ADD. Discussed continuing current medication regimen.  BP 104/70  Patient advised to contact office with any questions, adverse effects, or acute worsening in signs and symptoms.  Discussed potential benefits, risks, and side effects of stimulants with patient to include increased heart rate, palpitations, insomnia, increased anxiety, increased irritability, or decreased appetite.  Instructed patient to contact office if experiencing any significant tolerability issues.  There are no diagnoses linked to this encounter.   Please see After Visit Summary for patient specific instructions.  No future appointments.  No orders of the defined types were placed in this encounter.   -------------------------------

## 2023-03-28 DIAGNOSIS — F4322 Adjustment disorder with anxiety: Secondary | ICD-10-CM | POA: Diagnosis not present

## 2023-05-01 ENCOUNTER — Other Ambulatory Visit: Payer: Self-pay

## 2023-05-01 ENCOUNTER — Telehealth: Payer: Self-pay | Admitting: Adult Health

## 2023-05-01 MED ORDER — AMPHETAMINE-DEXTROAMPHET ER 15 MG PO CP24: 15.0000 mg | ORAL_CAPSULE | ORAL | 0 refills | Status: DC

## 2023-05-01 NOTE — Telephone Encounter (Signed)
 Patient Damon Brown requesting a refill on his Adderall. He is also requesting an increase to 15 mg instead of 10 mg. Appointment scheduled for 09/11/23.

## 2023-05-01 NOTE — Telephone Encounter (Signed)
 Pended RF for 15 mg XR Adderall. Canceled 10 mg XR in Epic.

## 2023-05-01 NOTE — Telephone Encounter (Signed)
LF 2/12

## 2023-06-29 ENCOUNTER — Telehealth: Payer: Self-pay | Admitting: Adult Health

## 2023-06-29 ENCOUNTER — Other Ambulatory Visit: Payer: Self-pay

## 2023-06-29 NOTE — Telephone Encounter (Signed)
 Patient lvm at 2:43 with refill request for Adderall  15mg . Ph: [phone] Appt 8/12 Pharmacy [facility] [address] [place]

## 2023-06-29 NOTE — Telephone Encounter (Signed)
 Pended Adderall  15 mg to [facility] on [address]

## 2023-07-02 MED ORDER — AMPHETAMINE-DEXTROAMPHET ER 15 MG PO CP24: 15.0000 mg | ORAL_CAPSULE | ORAL | 0 refills | Status: DC

## 2023-07-06 DIAGNOSIS — Z1389 Encounter for screening for other disorder: Secondary | ICD-10-CM | POA: Diagnosis not present

## 2023-07-06 DIAGNOSIS — Z23 Encounter for immunization: Secondary | ICD-10-CM | POA: Diagnosis not present

## 2023-07-06 DIAGNOSIS — Z0001 Encounter for general adult medical examination with abnormal findings: Secondary | ICD-10-CM | POA: Diagnosis not present

## 2023-07-06 DIAGNOSIS — Z13220 Encounter for screening for lipoid disorders: Secondary | ICD-10-CM | POA: Diagnosis not present

## 2023-07-06 DIAGNOSIS — Z0000 Encounter for general adult medical examination without abnormal findings: Secondary | ICD-10-CM | POA: Diagnosis not present

## 2023-07-30 DIAGNOSIS — J069 Acute upper respiratory infection, unspecified: Secondary | ICD-10-CM | POA: Diagnosis not present

## 2023-08-10 ENCOUNTER — Telehealth: Payer: Self-pay | Admitting: Adult Health

## 2023-08-10 ENCOUNTER — Other Ambulatory Visit: Payer: Self-pay

## 2023-08-10 MED ORDER — AMPHETAMINE-DEXTROAMPHET ER 15 MG PO CP24: 15.0000 mg | ORAL_CAPSULE | ORAL | 0 refills | Status: DC

## 2023-08-10 NOTE — Telephone Encounter (Signed)
 Next appt is 09/11/23. Requesting a refill on Adderall  15 mg XR call to:  [id number] - [place], [place] - [address] AT [facility] [address]   Phone: [phone]  Fax: [phone]

## 2023-08-10 NOTE — Telephone Encounter (Signed)
 Pended.

## 2023-09-04 ENCOUNTER — Telehealth: Payer: Self-pay | Admitting: Adult Health

## 2023-09-04 NOTE — Telephone Encounter (Signed)
 Called pharmacy, it was picked up on 7/14.

## 2023-09-04 NOTE — Telephone Encounter (Signed)
 LF 7/11, due 8/8.

## 2023-09-04 NOTE — Telephone Encounter (Signed)
 Pt left Adderall  in [employer] and called the pharm. The pharm said they will do early fill if they get confirmation from provider 1st.

## 2023-09-04 NOTE — Telephone Encounter (Signed)
 Pt reported he left his Adderall  in [employer]. He is asking for an early RF or enough to get him to his next appt. He picked it up on 7/14, due 8/12.

## 2023-09-05 ENCOUNTER — Other Ambulatory Visit: Payer: Self-pay

## 2023-09-05 MED ORDER — AMPHETAMINE-DEXTROAMPHET ER 15 MG PO CP24: 15.0000 mg | ORAL_CAPSULE | ORAL | 0 refills | Status: DC

## 2023-09-05 NOTE — Telephone Encounter (Signed)
 Pended.

## 2023-09-11 ENCOUNTER — Telehealth: Payer: Self-pay | Admitting: Adult Health

## 2023-09-11 ENCOUNTER — Encounter: Payer: Self-pay | Admitting: Adult Health

## 2023-09-11 DIAGNOSIS — F909 Attention-deficit hyperactivity disorder, unspecified type: Secondary | ICD-10-CM

## 2023-09-11 MED ORDER — AMPHETAMINE-DEXTROAMPHET ER 15 MG PO CP24: 15.0000 mg | ORAL_CAPSULE | ORAL | 0 refills | Status: AC

## 2023-09-11 NOTE — Progress Notes (Signed)
 Damon Brown [id number] 24-Mar-1995 28 y.o.  Virtual Visit via Video Note  I connected with pt @ on 09/11/23 at  9:00 AM EDT by a video enabled telemedicine application and verified that I am speaking with the correct person using two identifiers.   I discussed the limitations of evaluation and management by telemedicine and the availability of in person appointments. The patient expressed understanding and agreed to proceed.  I discussed the assessment and treatment plan with the patient. The patient was provided an opportunity to ask questions and all were answered. The patient agreed with the plan and demonstrated an understanding of the instructions.   The patient was advised to call back or seek an in-person evaluation if the symptoms worsen or if the condition fails to improve as anticipated.  I provided 10 minutes of non-face-to-face time during this encounter.  The patient was located at home.  The provider was located at [facility].   Angeline LOISE Sayers, NP   Subjective:   Patient ID:  Damon Brown is a 28 y.o. (DOB 05-18-1995) male.  Chief Complaint: No chief complaint on file.   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   Describes mood today as ok. Pleasant. Denies tearfulness. Mood symptoms - denies depression, anxiety and irritability. Reports stable interest and motivation. Denies panic attacks. Denies worry, rumination and over thinking. Reports mood is stable. Stating I feel like I'm doing pretty good. Taking medications as prescribed.  Energy levels stable. 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 Enjoys some usual interests and activities. Lives with a girlfriend - dog and 2 cats. Parents local and supportive. Spending time with family. Appetite adequate. Weight stable - 200 pounds. Sleeps well most nights. Averages 7.5 to 8 hours. Focus and concentration stable. Completing tasks. Managing  aspects of household. Work going well Counsellor.  Denies SI or HI.  Denies AH or VH. Denies self harm. Denies substance use.  Review of Systems:  Review of Systems  Musculoskeletal:  Negative for gait problem.  Neurological:  Negative for tremors.  Psychiatric/Behavioral:         Please refer to HPI    Medications: I have reviewed the patient's current medications.  Current Outpatient Medications  Medication Sig Dispense Refill   amphetamine -dextroamphetamine (ADDERALL  XR) 15 MG 24 hr capsule Take 1 capsule by mouth every morning. 30 capsule 0   amphetamine -dextroamphetamine (ADDERALL  XR) 15 MG 24 hr capsule Take 1 capsule by mouth every morning. 30 capsule 0   No current facility-administered medications for this visit.    Medication Side Effects: None  Allergies: No Known Allergies  Past Medical History:  Diagnosis Date   Anxiety    Depression     Family History  Problem Relation Age of Onset   Drug abuse Father    Cancer Maternal Grandmother        breast   Cancer Maternal Grandfather        colon    Social History   Socioeconomic History   Marital status: Single    Spouse name: Not on file   Number of children: Not on file   Years of education: Not on file   Highest education level: Not on file  Occupational History   Not on file  Tobacco Use   Smoking status: Never   Smokeless tobacco: Never  Substance and Sexual Activity   Alcohol use: Not on file  Drug use: Not on file   Sexual activity: Not on file  Other Topics Concern   Not on file  Social History Narrative   ** Merged History Encounter **       Social Drivers of Health   Financial Resource Strain: Not on file  Food Insecurity: Not on file  Transportation Needs: Not on file  Physical Activity: Not on file  Stress: Not on file  Social Connections: Not on file  Intimate Partner Violence: Not on file    Past Medical History, Surgical history, Social history, and  Family history were reviewed and updated as appropriate.   Please see review of systems for further details on the patient's review from today.   Objective:   Physical Exam:  There were no vitals taken for this visit.  Physical Exam Constitutional:      General: He is not in acute distress. Musculoskeletal:        General: No deformity.  Neurological:     Mental Status: He is alert and oriented to person, place, and time.     Coordination: Coordination normal.  Psychiatric:        Attention and Perception: Attention and perception normal. He does not perceive auditory or visual hallucinations.        Mood and Affect: Mood normal. Mood is not anxious or depressed. Affect is not labile, blunt, angry or inappropriate.        Speech: Speech normal.        Behavior: Behavior normal.        Thought Content: Thought content normal. Thought content is not paranoid or delusional. Thought content does not include homicidal or suicidal ideation. Thought content does not include homicidal or suicidal plan.        Cognition and Memory: Cognition and memory normal.        Judgment: Judgment normal.     Comments: Insight intact     Lab Review:     Component Value Date/Time   NA 136 11/22/2011 1741   K 4.1 11/22/2011 1741   CL 105 11/22/2011 1741   CO2 25 11/22/2011 1741   GLUCOSE 74 11/22/2011 1741   BUN 26 (H) 11/22/2011 1741   CREATININE 1.05 11/22/2011 1741   CALCIUM 8.8 11/22/2011 1741   PROT 6.1 11/22/2011 1741   ALBUMIN 4.2 11/22/2011 1741   AST 20 11/22/2011 1741   ALT 17 11/22/2011 1741   ALKPHOS 68 11/22/2011 1741   BILITOT 0.7 11/22/2011 1741   GFRNONAA NOT CALCULATED 11/02/2009 1653   GFRAA  11/02/2009 1653    NOT CALCULATED        The eGFR has been calculated using the MDRD equation. This calculation has not been validated in all clinical situations. eGFR's persistently <60 mL/min signify possible Chronic Kidney Disease.       Component Value Date/Time   WBC  5.7 03/11/2014 0937   WBC 4.3 11/22/2011 1741   RBC 4.95 03/11/2014 0937   RBC 4.13 (L) 11/22/2011 1741   HGB 16.3 03/11/2014 0937   HGB 14.0 11/22/2011 1741   HCT 48.2 03/11/2014 0937   HCT 39.3 11/22/2011 1741   PLT 276 11/22/2011 1741   MCV 97.4 (A) 03/11/2014 0937   MCH 33.0 (A) 03/11/2014 0937   MCH 33.9 11/22/2011 1741   MCHC 33.9 03/11/2014 0937   MCHC 35.6 11/22/2011 1741   RDW 12.9 11/22/2011 1741   LYMPHSABS 1.6 11/22/2011 1741   MONOABS 0.5 11/22/2011 1741   EOSABS 0.1 11/22/2011 1741  BASOSABS 0.0 11/22/2011 1741    No results found for: POCLITH, LITHIUM   No results found for: PHENYTOIN, PHENOBARB, VALPROATE, CBMZ   .res Assessment: Plan:    Plan:  PDMP reviewed  Adderall  XR 15mg  every morning - will call in 3 months for next set of refills.   10 minutes spent dedicated to the care of this patient on the date of this encounter to include pre-visit review of records, ordering of medication, post visit documentation, and face-to-face time with the patient discussing ADD. Discussed continuing current medication regimen.  Monitor BP between visits while taking stimulant medication.   Patient advised to contact office with any questions, adverse effects, or acute worsening in signs and symptoms.  Discussed potential benefits, risks, and side effects of stimulants with patient to include increased heart rate, palpitations, insomnia, increased anxiety, increased irritability, or decreased appetite.  Instructed patient to contact office if experiencing any significant tolerability issues.  There are no diagnoses linked to this encounter.   Please see After Visit Summary for patient specific instructions.  Future Appointments  Date Time Provider Department Center  09/11/2023  9:00 AM Jasyn Mey Nattalie, NP [facility] None    No orders of the defined types were placed in this encounter.     -------------------------------

## 2023-10-19 DIAGNOSIS — M7652 Patellar tendinitis, left knee: Secondary | ICD-10-CM | POA: Diagnosis not present

## 2023-10-31 DIAGNOSIS — S86812A Strain of other muscle(s) and tendon(s) at lower leg level, left leg, initial encounter: Secondary | ICD-10-CM | POA: Diagnosis not present

## 2023-12-07 ENCOUNTER — Telehealth: Payer: Self-pay | Admitting: Adult Health

## 2023-12-07 NOTE — Telephone Encounter (Signed)
 Damon Brown called and said that he needs a refill on his adderall  xr 15 mg. Pharmacy is [facility] on [address].

## 2023-12-07 NOTE — Telephone Encounter (Signed)
 Pt has a RF available. Verified with pharmacy, asked them to fill today and notified patient that would be available after 5 PM.
